# Patient Record
Sex: Male | Born: 2000 | Hispanic: No | Marital: Single | Smoking: Never smoker
Health system: Southern US, Community
[De-identification: ages and names within clinical notes are randomized; demographics above are authoritative.]

## PROBLEM LIST (undated history)

## (undated) DIAGNOSIS — Z9229 Personal history of other drug therapy: Secondary | ICD-10-CM

## (undated) DIAGNOSIS — S83206A Unspecified tear of unspecified meniscus, current injury, right knee, initial encounter: Secondary | ICD-10-CM

## (undated) HISTORY — PX: NO PAST SURGERIES: SHX2092

---
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
## 2021-02-17 DIAGNOSIS — Z20822 Contact with and (suspected) exposure to covid-19: Secondary | ICD-10-CM | POA: Diagnosis not present

## 2021-02-18 DIAGNOSIS — Z20822 Contact with and (suspected) exposure to covid-19: Secondary | ICD-10-CM | POA: Diagnosis not present

## 2021-03-27 IMAGING — MR MR KNEE*R* W/O CM
4 of 7 series · 22 of 40 positions shown · non-contrast
Comparison: None.

CLINICAL DATA: Right knee injury after being tackled 10 weeks ago
playing soccer

EXAM:
MRI OF THE RIGHT KNEE WITHOUT CONTRAST
TECHNIQUE: Multiplanar, multisequence MR imaging of the knee was performed. No
intravenous contrast was administered.

[Series 3: T2 fat-sat · axial · 4.0mm · 0.50mm/px · z∈[-82,+63]mm · 6 of 30 slices shown (1 of 2)]
[im 1/30]
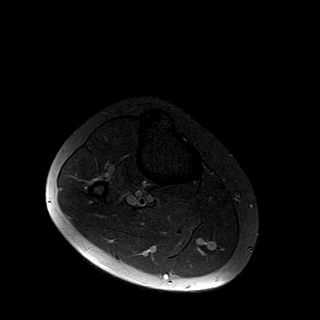
[im 6/30]
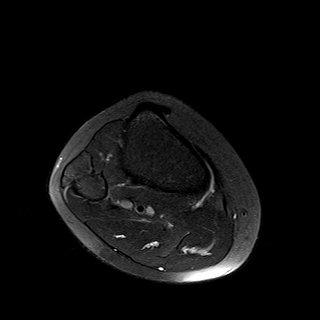
[im 12/30]
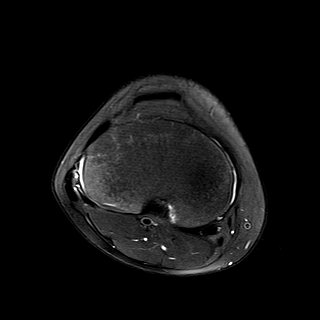
[im 18/30]
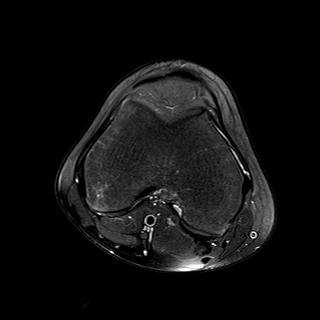
[im 24/30]
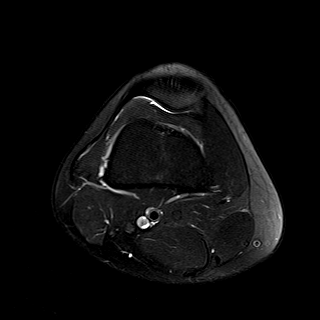
[im 30/30]
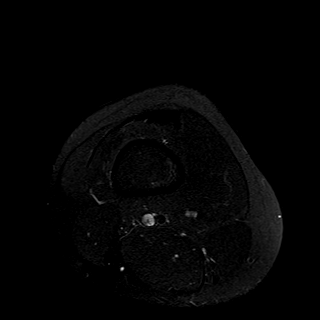

[Series 5: T2 fat-sat · coronal · 4.0mm · 0.29mm/px · 4 of 23 slices shown (2 of 2)]
[im 1/23]
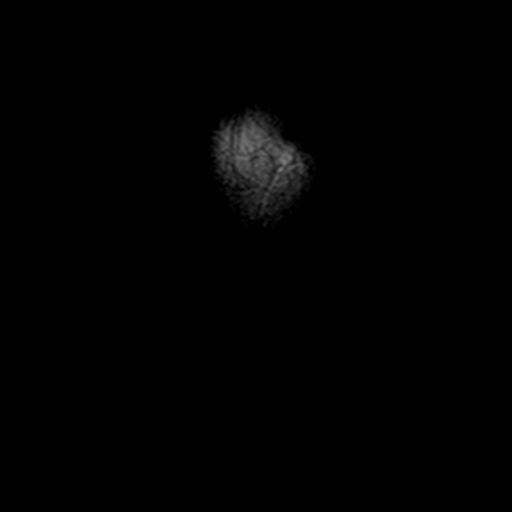
[im 6/23]
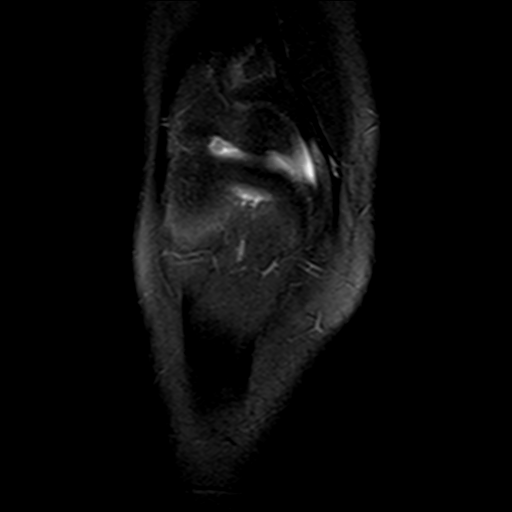
[im 12/23]
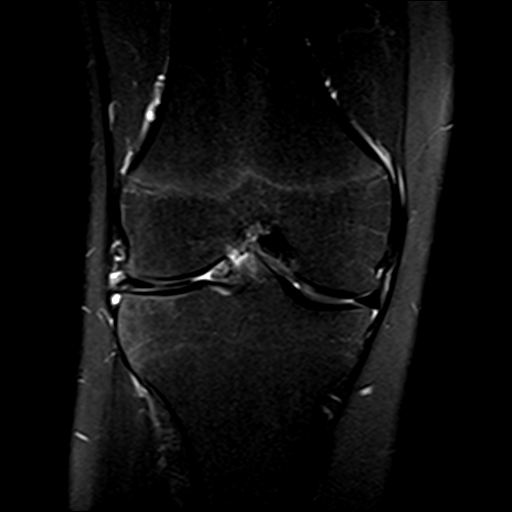
[im 23/23]
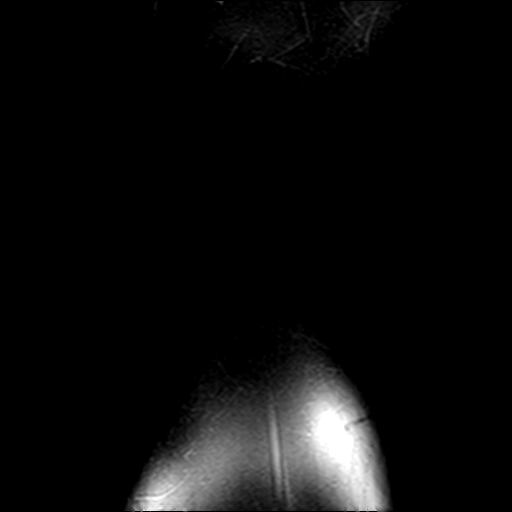

[Series 7: PD fat-sat · coronal · 3.0mm · 0.29mm/px · 6 of 29 slices shown (1 of 2)]
[im 1/29]
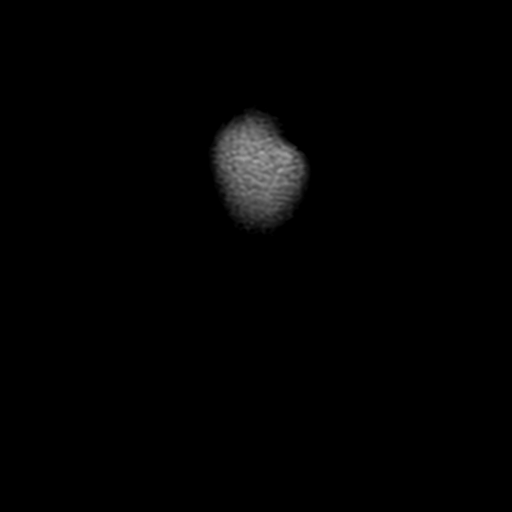
[im 6/29]
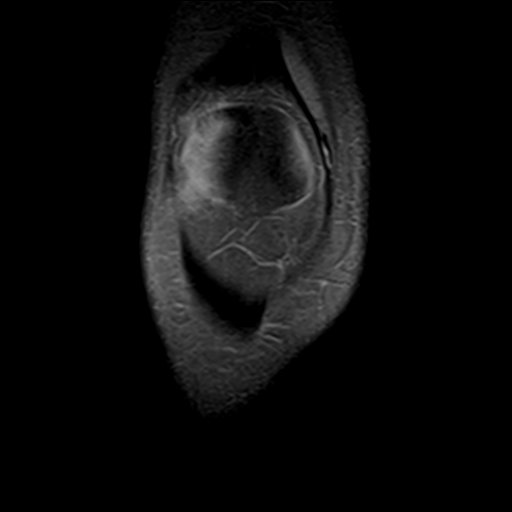
[im 12/29]
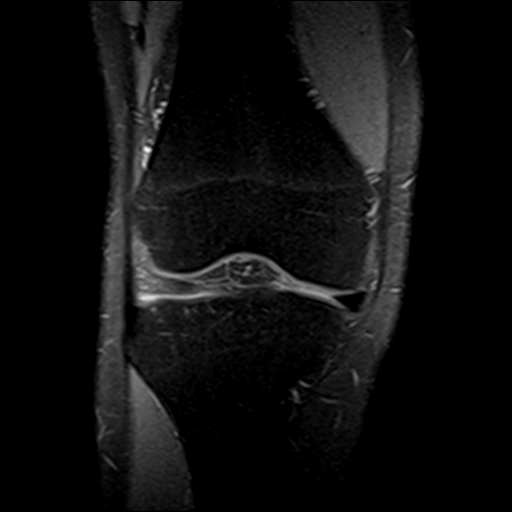
[im 17/29]
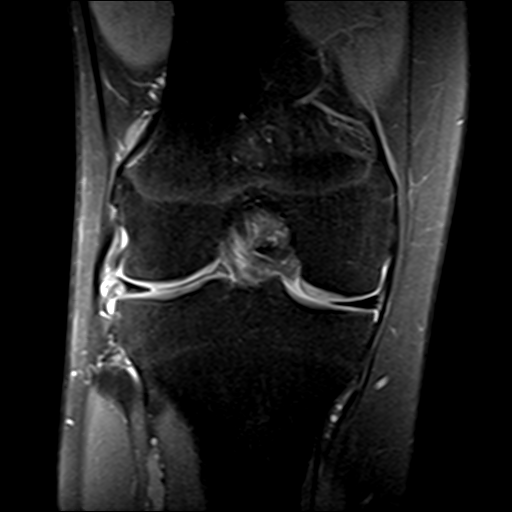
[im 23/29]
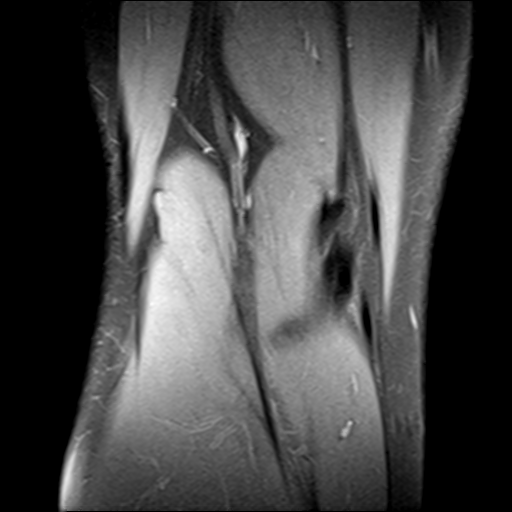
[im 29/29]
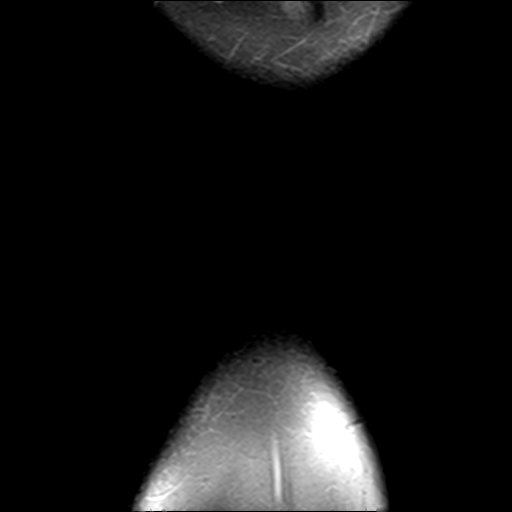

[Series 9: PD fat-sat · sagittal · 3.0mm · 0.29mm/px · 6 of 29 slices shown (2 of 2)]
[im 1/29]
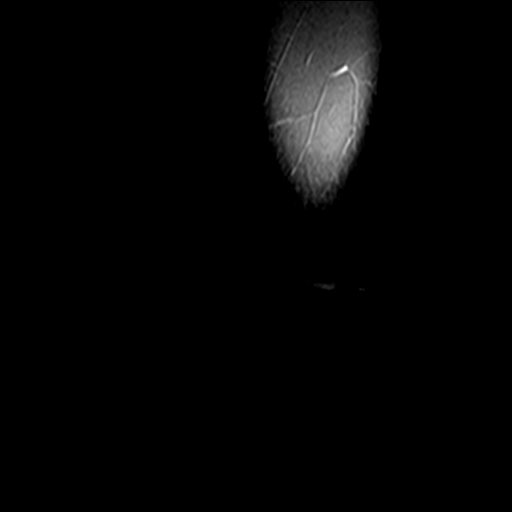
[im 6/29]
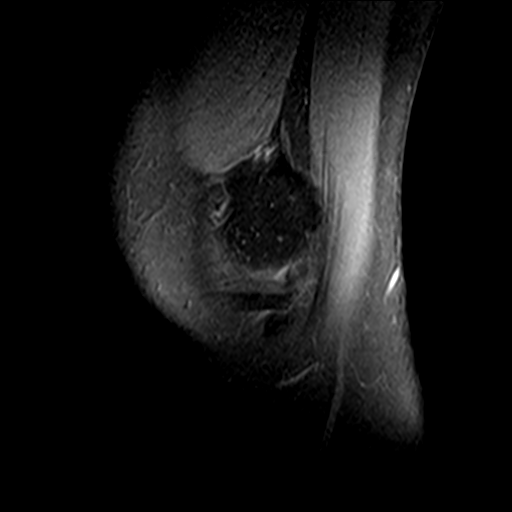
[im 12/29]
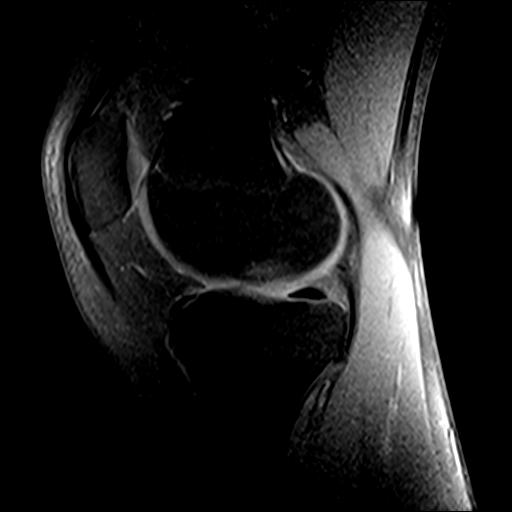
[im 17/29]
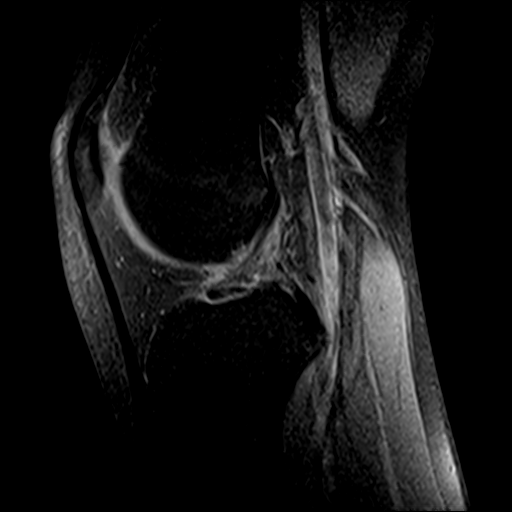
[im 23/29]
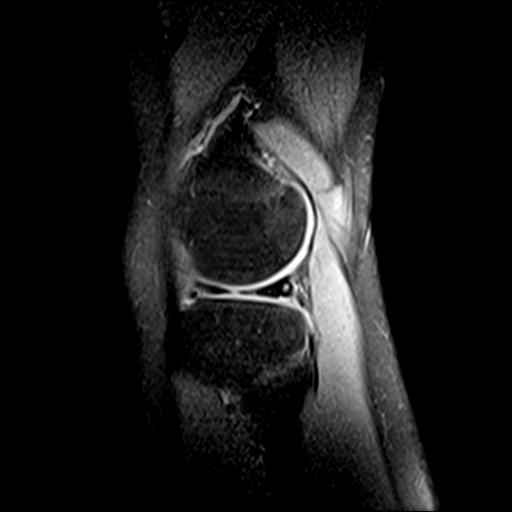
[im 29/29]
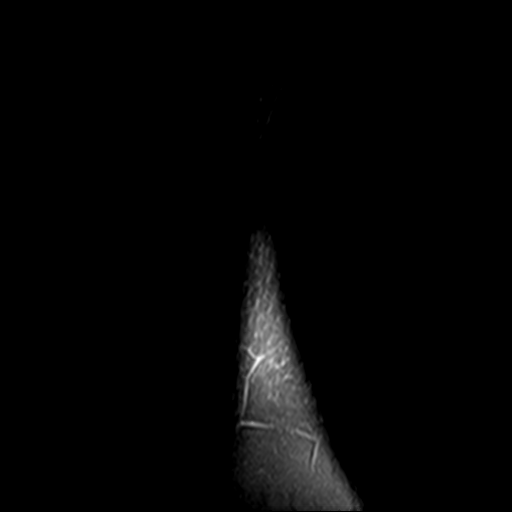

[22 of 40 positions shown; findings below may reference images not displayed]

FINDINGS: MENISCI

Medial: Intact.

Lateral: There is a peripheral horizontal longitudinal tear seen of
the posterior horn of the medial meniscus which extends to the mid
body and anterior horn. The root attachments however are still
intact.

LIGAMENTS

Cruciates: The ACL is intact. The PCL is intact.

Collaterals: The MCL is intact. The lateral collateral ligamentous
complex is intact.

CARTILAGE

Patellofemoral: Normal.

Medial compartment: Normal.

Lateral compartment: There is a probable focal disruption of the
popliteal fibular ligament. Mildly increased signal seen in the
fibular collateral ligament, however it is intact.

BONES: There is increased marrow signal seen in the posterior
lateral femoral condyle and the lateral tibial plateau. No definite
osseous fracture is seen. No avascular necrosis. No pathologic
marrow infiltration.

JOINT: No joint effusion. Normal Nayfi Chamo. No plical
thickening.

EXTENSOR MECHANISM: The patellar and quadriceps tendon are intact.
The retinaculum is unremarkable.

POPLITEAL FOSSA: No popliteal cyst.

OTHER:  The visualized muscles are normal in appearance.
IMPRESSION: 1. Horizontal nondisplaced tear of the posterior lateral meniscus
extending to the anterior horn. The root attachment however still
intact.
2. Osseous contusions of the lateral femoral condyle and lateral
tibial plateau. No osseous fracture.
3. Probable focal disruption of the popliteal fibular ligament
intrasubstance sprain of the fibular collateral ligament.

## 2024-01-21 ENCOUNTER — Other Ambulatory Visit: Payer: Self-pay

## 2024-01-21 ENCOUNTER — Encounter (HOSPITAL_BASED_OUTPATIENT_CLINIC_OR_DEPARTMENT_OTHER): Payer: Self-pay | Admitting: Orthopedic Surgery

## 2024-01-24 NOTE — H&P (Signed)
 PREOPERATIVE H&P  Chief Complaint: LEFT KNEE LATERAL MENISCUS TEAR, GANGLION  HPI: Jesse Shields is a 23 y.o. male who presents with a diagnosis of LEFT KNEE LATERAL MENISCUS TEAR, GANGLION. Symptoms are rated as moderate to severe, and have been worsening.  This is significantly impairing activities of daily living.  He has elected for surgical management.   Past Medical History:  Diagnosis Date   Immunizations up to date    Right knee meniscal tear    injury playing soccer 10/ 2020   Past Surgical History:  Procedure Laterality Date   KNEE ARTHROSCOPY WITH LATERAL MENISECTOMY Right 08/25/2019   Procedure: KNEE ARTHROSCOPY WITH LATERAL MENISECTOMY;  Surgeon: [name], MD;  Location: [facility];  Service: Orthopedics;  Laterality: Right;   Social History   Socioeconomic History   Marital status: Single    Spouse name: Not on file   Number of children: Not on file   Years of education: Not on file   Highest education level: Not on file  Occupational History   Not on file  Tobacco Use   Smoking status: Never   Smokeless tobacco: Never  Vaping Use   Vaping status: Never Used  Substance and Sexual Activity   Alcohol use: Never    Alcohol/week: 0.0 standard drinks of alcohol   Drug use: Never   Sexual activity: Not on file  Other Topics Concern   Not on file  Social History Narrative   No Family anesthesia problems      No smoker in home.      Lives w/ both parents and 3 silbings      Social Drivers of Corporate investment banker Strain: Not on file  Food Insecurity: Not on file  Transportation Needs: Not on file  Physical Activity: Not on file  Stress: Not on file  Social Connections: Not on file   History reviewed. No pertinent family history. No Known Allergies Prior to Admission medications   Medication Sig Start Date End Date Taking? Authorizing Provider  HYDROcodone -acetaminophen  (NORCO) 5-325 MG tablet Take 1-2 tablets by mouth  every 6 (six) hours as needed for severe pain. 08/25/19   Martensen, Missie Ammons III, PA-C  ondansetron  (ZOFRAN ) 4 MG tablet Take 1 tablet (4 mg total) by mouth every 8 (eight) hours as needed for nausea or vomiting. 08/25/19   Carlie Chen III, PA-C     Positive ROS: All other systems have been reviewed and were otherwise negative with the exception of those mentioned in the HPI and as above.  Physical Exam: General: Alert, no acute distress Cardiovascular: No pedal edema Respiratory: No cyanosis, no use of accessory musculature GI: No organomegaly, abdomen is soft and non-tender Skin: No lesions in the area of chief complaint Neurologic: Sensation intact distally Psychiatric: Patient is competent for consent with normal mood and affect Lymphatic: No axillary or cervical lymphadenopathy  MUSCULOSKELETAL: TTP anterolateral joint line, pain with extension, palpable crepitus laterally, + McMurrays test, NVI   Imaging: MRI shows a tear in the anterior horn and body of the lateral meniscus with a parameniscal cyst   Assessment: LEFT KNEE LATERAL MENISCUS TEAR, GANGLION  Plan: Plan for Procedure(s): REPAIR, MENISCUS, KNEE EXCISION, LESION, MENISCUS  The risks benefits and alternatives were discussed with the patient including but not limited to the risks of nonoperative treatment, versus surgical intervention including infection, bleeding, nerve injury,  blood clots, cardiopulmonary complications, morbidity, mortality, among others, and they were willing to proceed.  Weightbearing: NWB LLE vs WBAT in KI Orthopedic devices: knee brace Showering: POD 3 Dressing: reinforce PRN Medicines: Norco, Celebrex, Zofran   Discharge: home Follow up: 02/07/24 at 9:45am    Lore Rode, PA-C Office [phone] 01/24/2024 2:07 PM

## 2024-01-28 ENCOUNTER — Ambulatory Visit (HOSPITAL_BASED_OUTPATIENT_CLINIC_OR_DEPARTMENT_OTHER)
Admission: RE | Admit: 2024-01-28 | Discharge: 2024-01-28 | Disposition: A | Discharge status: Home / Self Care | Attending: Orthopedic Surgery | Admitting: Orthopedic Surgery

## 2024-01-28 ENCOUNTER — Ambulatory Visit (HOSPITAL_BASED_OUTPATIENT_CLINIC_OR_DEPARTMENT_OTHER): Admitting: Anesthesiology

## 2024-01-28 ENCOUNTER — Encounter (HOSPITAL_BASED_OUTPATIENT_CLINIC_OR_DEPARTMENT_OTHER)
Admission: RE | Disposition: A | Discharge status: Home / Self Care | Payer: Self-pay | Source: Home / Self Care | Attending: Orthopedic Surgery

## 2024-01-28 ENCOUNTER — Other Ambulatory Visit: Payer: Self-pay

## 2024-01-28 ENCOUNTER — Encounter (HOSPITAL_BASED_OUTPATIENT_CLINIC_OR_DEPARTMENT_OTHER): Payer: Self-pay | Admitting: Orthopedic Surgery

## 2024-01-28 DIAGNOSIS — M67462 Ganglion, left knee: Secondary | ICD-10-CM | POA: Diagnosis not present

## 2024-01-28 DIAGNOSIS — S83282A Other tear of lateral meniscus, current injury, left knee, initial encounter: Secondary | ICD-10-CM

## 2024-01-28 HISTORY — PX: KNEE ARTHROSCOPY WITH DRILLING/MICROFRACTURE: SHX6425

## 2024-01-28 HISTORY — PX: MENISCUS REPAIR: SHX5179

## 2024-01-28 HISTORY — PX: EXCISION, LESION, MENISCUS: SHX7565

## 2024-01-28 SURGERY — REPAIR, MENISCUS, KNEE
Anesthesia: General | Site: Knee | Laterality: Left

## 2024-01-28 MED ORDER — MIDAZOLAM HCL 2 MG/2ML IJ SOLN
INTRAMUSCULAR | Status: AC
  Filled 2024-01-28: qty 2

## 2024-01-28 MED ORDER — FENTANYL CITRATE (PF) 100 MCG/2ML IJ SOLN
INTRAMUSCULAR | Status: AC
  Filled 2024-01-28: qty 2

## 2024-01-28 MED ORDER — LIDOCAINE HCL (CARDIAC) PF 100 MG/5ML IV SOSY
PREFILLED_SYRINGE | INTRAVENOUS | Status: DC | PRN
  Administered 2024-01-28: 40 mg via INTRAVENOUS

## 2024-01-28 MED ORDER — SODIUM CHLORIDE 0.9 % IR SOLN
Status: DC | PRN
Start: 2024-01-28 — End: 2024-01-28
  Administered 2024-01-28: 4000 mL

## 2024-01-28 MED ORDER — CEFAZOLIN SODIUM-DEXTROSE 2-4 GM/100ML-% IV SOLN
INTRAVENOUS | Status: AC
  Filled 2024-01-28: qty 100

## 2024-01-28 MED ORDER — POVIDONE-IODINE 10 % EX SWAB
2.0000 | Freq: Once | CUTANEOUS | Status: AC
  Administered 2024-01-28: 2 via TOPICAL

## 2024-01-28 MED ORDER — CELECOXIB 200 MG PO CAPS: 200.0000 mg | ORAL_CAPSULE | Freq: Two times a day (BID) | ORAL | 0 refills | Status: AC | PRN

## 2024-01-28 MED ORDER — ACETAMINOPHEN 500 MG PO TABS
ORAL_TABLET | ORAL | Status: AC
  Filled 2024-01-28: qty 2

## 2024-01-28 MED ORDER — ACETAMINOPHEN 500 MG PO TABS
1000.0000 mg | ORAL_TABLET | Freq: Once | ORAL | Status: AC
  Administered 2024-01-28: 1000 mg via ORAL

## 2024-01-28 MED ORDER — KETOROLAC TROMETHAMINE 30 MG/ML IJ SOLN
INTRAMUSCULAR | Status: AC
  Filled 2024-01-28: qty 1

## 2024-01-28 MED ORDER — PROPOFOL 10 MG/ML IV BOLUS
INTRAVENOUS | Status: DC | PRN
  Administered 2024-01-28: 200 mg via INTRAVENOUS

## 2024-01-28 MED ORDER — KETOROLAC TROMETHAMINE 30 MG/ML IJ SOLN
INTRAMUSCULAR | Status: DC | PRN
  Administered 2024-01-28: 30 mg via INTRAVENOUS

## 2024-01-28 MED ORDER — DEXAMETHASONE SODIUM PHOSPHATE 10 MG/ML IJ SOLN
INTRAMUSCULAR | Status: DC | PRN
  Administered 2024-01-28: 10 mg via INTRAVENOUS

## 2024-01-28 MED ORDER — CEFAZOLIN SODIUM-DEXTROSE 2-4 GM/100ML-% IV SOLN
2.0000 g | INTRAVENOUS | Status: AC
  Administered 2024-01-28: 2 g via INTRAVENOUS

## 2024-01-28 MED ORDER — MIDAZOLAM HCL 2 MG/2ML IJ SOLN
INTRAMUSCULAR | Status: DC | PRN
  Administered 2024-01-28: 2 mg via INTRAVENOUS

## 2024-01-28 MED ORDER — FENTANYL CITRATE (PF) 100 MCG/2ML IJ SOLN
INTRAMUSCULAR | Status: DC | PRN
  Administered 2024-01-28 (×2): 25 ug via INTRAVENOUS

## 2024-01-28 MED ORDER — ONDANSETRON 4 MG PO TBDP: 4.0000 mg | ORAL_TABLET | Freq: Three times a day (TID) | ORAL | 0 refills | Status: AC | PRN

## 2024-01-28 MED ORDER — FENTANYL CITRATE (PF) 100 MCG/2ML IJ SOLN
25.0000 ug | INTRAMUSCULAR | Status: DC | PRN
  Administered 2024-01-28 (×2): 25 ug via INTRAVENOUS

## 2024-01-28 MED ORDER — HYDROCODONE-ACETAMINOPHEN 10-325 MG PO TABS: 1.0000 | ORAL_TABLET | Freq: Four times a day (QID) | ORAL | 0 refills | Status: AC | PRN

## 2024-01-28 MED ORDER — ONDANSETRON HCL 4 MG/2ML IJ SOLN: 4.0000 mg | Freq: Four times a day (QID) | INTRAMUSCULAR | Status: DC | PRN

## 2024-01-28 MED ORDER — BUPIVACAINE HCL (PF) 0.5 % IJ SOLN
INTRAMUSCULAR | Status: DC | PRN
Start: 2024-01-28 — End: 2024-01-28
  Administered 2024-01-28: 20 mL

## 2024-01-28 MED ORDER — LACTATED RINGERS IV SOLN: INTRAVENOUS | Status: DC

## 2024-01-28 MED ORDER — OXYCODONE HCL 5 MG PO TABS: 5.0000 mg | ORAL_TABLET | Freq: Once | ORAL | Status: DC | PRN

## 2024-01-28 MED ORDER — ONDANSETRON HCL 4 MG/2ML IJ SOLN
INTRAMUSCULAR | Status: DC | PRN
  Administered 2024-01-28: 4 mg via INTRAVENOUS

## 2024-01-28 MED ORDER — OXYCODONE HCL 5 MG/5ML PO SOLN: 5.0000 mg | Freq: Once | ORAL | Status: DC | PRN

## 2024-01-28 SURGICAL SUPPLY — 42 items
BLADE SURG 15 STRL LF DISP TIS (BLADE) IMPLANT
BNDG ELASTIC 6INX 5YD STR LF (GAUZE/BANDAGES/DRESSINGS) ×1 IMPLANT
BURR OVAL 8 FLU 4.0X13 (MISCELLANEOUS) IMPLANT
CHLORAPREP W/TINT 26 (MISCELLANEOUS) ×1 IMPLANT
CUFF TRNQT CYL 24X4X40X1 (TOURNIQUET CUFF) IMPLANT
CUFF TRNQT CYL 34X4.125X (TOURNIQUET CUFF) ×1 IMPLANT
DISSECTOR 3.8MM X 13CM (MISCELLANEOUS) ×1 IMPLANT
DISSECTOR 4.0MM X 13CM (MISCELLANEOUS) IMPLANT
DRAPE ARTHROSCOPY W/POUCH 90 (DRAPES) ×1 IMPLANT
DRAPE IMP U-DRAPE 54X76 (DRAPES) ×1 IMPLANT
DRAPE U-SHAPE 47X51 STRL (DRAPES) ×1 IMPLANT
DRSG EMULSION OIL 3X3 NADH (GAUZE/BANDAGES/DRESSINGS) ×1 IMPLANT
ELECTRODE REM PT RTRN 9FT ADLT (ELECTROSURGICAL) IMPLANT
EXCALIBUR 3.8MM X 13CM (MISCELLANEOUS) IMPLANT
GAUZE SPONGE 4X4 12PLY STRL (GAUZE/BANDAGES/DRESSINGS) ×1 IMPLANT
GLOVE BIO SURGEON STRL SZ7.5 (GLOVE) ×1 IMPLANT
GLOVE BIOGEL PI IND STRL 7.5 (GLOVE) ×2 IMPLANT
GLOVE BIOGEL PI IND STRL 8 (GLOVE) ×1 IMPLANT
GLOVE BIOGEL PI IND STRL 8.5 (GLOVE) IMPLANT
GLOVE SURG SYN 7.5 E (GLOVE) ×2 IMPLANT
GLOVE SURG SYN 7.5 PF PI (GLOVE) ×1 IMPLANT
GOWN STRL REUS W/ TWL LRG LVL3 (GOWN DISPOSABLE) ×3 IMPLANT
GOWN STRL REUS W/ TWL XL LVL3 (GOWN DISPOSABLE) ×1 IMPLANT
MANIFOLD NEPTUNE II (INSTRUMENTS) ×1 IMPLANT
NDL HD SCORPION MEGA LOADER (NEEDLE) IMPLANT
NDL SUT 2-0 SCORPION KNEE (NEEDLE) IMPLANT
NEEDLE SUT 2-0 SCORPION KNEE (NEEDLE) IMPLANT
PACK ARTHROSCOPY DSU (CUSTOM PROCEDURE TRAY) ×1 IMPLANT
PACK BASIN DAY SURGERY FS (CUSTOM PROCEDURE TRAY) ×1 IMPLANT
PACK ICE MAXI GEL EZY WRAP (MISCELLANEOUS) ×1 IMPLANT
PENCIL SMOKE EVACUATOR (MISCELLANEOUS) IMPLANT
SLEEVE SCD COMPRESS KNEE MED (STOCKING) ×1 IMPLANT
SUCTION TUBE FRAZIER 10FR DISP (SUCTIONS) IMPLANT
SUT ETHILON 3 0 PS 1 (SUTURE) ×1 IMPLANT
SUT PDS AB 0 CT 36 (SUTURE) IMPLANT
TAPE CLOTH 3X10 TAN LF (GAUZE/BANDAGES/DRESSINGS) IMPLANT
TOWEL GREEN STERILE FF (TOWEL DISPOSABLE) ×1 IMPLANT
TUBE CONNECTING 20X1/4 (TUBING) IMPLANT
TUBING ARTHROSCOPY IRRIG 16FT (MISCELLANEOUS) ×1 IMPLANT
WAND ABLATOR APOLLO I90 (BUR) IMPLANT
WATER STERILE IRR 1000ML POUR (IV SOLUTION) ×1 IMPLANT
WRAP KNEE MAXI GEL POST OP (GAUZE/BANDAGES/DRESSINGS) ×1 IMPLANT

## 2024-01-28 NOTE — Transfer of Care (Signed)
 Immediate Anesthesia Transfer of Care Note  Patient: Jesse Shields  Procedure(s) Performed: REPAIR, MENISCUS, KNEE (Left: Knee) EXCISION, LEFT KNEE GANGLION (Left: Knee) ARTHROSCOPY, KNEE, WITH ABRASION ARTHROPLASTY OR MICROFRACTURE (Left: Knee)  Patient Location: PACU  Anesthesia Type:General  Level of Consciousness: awake and patient cooperative  Airway & Oxygen Therapy: Patient Spontanous Breathing and Patient connected to nasal cannula oxygen  Post-op Assessment: Report given to RN and Post -op Vital signs reviewed and stable  Post vital signs: Reviewed and stable  Last Vitals:  Vitals Value Taken Time  BP    Temp    Pulse 72 01/28/24 1257  Resp 17 01/28/24 1257  SpO2 100 % 01/28/24 1257    Last Pain:  Vitals:   01/28/24 1009  TempSrc: Temporal  PainSc: 0-No pain         Complications: No notable events documented.

## 2024-01-28 NOTE — Interval H&P Note (Signed)
 History and Physical Interval Note:  01/28/2024 11:04 AM  Jesse Shields  has presented today for surgery, with the diagnosis of LEFT KNEE LATERAL MENISCUS TEAR, GANGLION.  The various methods of treatment have been discussed with the patient and family. After consideration of risks, benefits and other options for treatment, the patient has consented to  Procedure(s): REPAIR, MENISCUS, KNEE (Left) EXCISION, LESION, MENISCUS (Left) as a surgical intervention.  The patient's history has been reviewed, patient examined, no change in status, stable for surgery.  I have reviewed the patient's chart and labs.  Questions were answered to the patient's satisfaction.     [name]

## 2024-01-28 NOTE — Op Note (Signed)
 01/28/2024  12:35 PM  PATIENT:  Jesse Shields    PRE-OPERATIVE DIAGNOSIS:  LEFT KNEE LATERAL MENISCUS TEAR, GANGLION  POST-OPERATIVE DIAGNOSIS:  Same  PROCEDURE:  REPAIR, MENISCUS, KNEE, EXCISION, LEFT KNEE GANGLION, ARTHROSCOPY, KNEE, WITH ABRASION ARTHROPLASTY OR MICROFRACTURE  SURGEON:  [name], MD  ASSISTANT: Marzella Solan, PA-C, he was present and scrubbed throughout the case, critical for completion in a timely fashion, and for retraction, instrumentation, and closure.   ANESTHESIA:   General  BLOOD LOSS: min  COMPLICATIONS: None   PREOPERATIVE INDICATIONS:  Jesse Shields is a  23 y.o. male with a diagnosis of LEFT KNEE LATERAL MENISCUS TEAR, GANGLION who failed conservative measures and elected for surgical management.    The risks benefits and alternatives were discussed with the patient preoperatively including but not limited to the risks of infection, bleeding, nerve injury, cardiopulmonary complications, the need for revision surgery, among others, and the patient was willing to proceed.  OPERATIVE IMPLANTS: pds stitch  OPERATIVE FINDINGS: Examination under anesthesia: stable Diagnostic Arthroscopy:  articular cartilage:stable Medial meniscus: Stable Lateral meniscus: Capsular tear anterior horn Anterior cruciate ligament/PCL: Stable Loose bodies: None    OPERATIVE PROCEDURE:  Patient was identified in the preoperative holding area and site was marked by me male was transported to the operating theater and placed on the table in supine position taking care to pad all bony prominences. After a preincinduction time out anesthesia was induced.  male received Ancef  for preoperative antibiotics. The left lower extremity was prepped and draped in normal sterile fashion and a pre-incision timeout was performed.   A small stab incision was made in the anterolateral portal position. The arthroscope was introduced in the joint. A medial portal was then established under  direct visualization just above the anterior horn of the medial meniscus. Diagnostic arthroscopy was then carried out with findings as described above.  The first examined his knee he had a small radial tear to the lateral meniscus I debrided this with a shaver  Next I made an incision after localizing this with a spinal needle at the capsular injury as this open incision to develop around the ganglion cyst and evacuate this excising the ganglion cyst from the knee  Next I used a spinal needle to pass a PDS stitch in a horizontal fashion around the anterior horn of the meniscus I then secured this around the capsule and was very happy with the repair of the meniscus to the capsule  Next I performed a microfracture at his notch to aid in healing of the meniscus.  The arthroscopic equipment was removed from the joint and the portals were closed with 3-0 nylon in an interrupted fashion. The knee was infiltrated with Marcaine .  Sterile dressings were then applied including Xeroform 4 x 4's ABDs an ACE bandage.  The patient was then allowed to awaken from general anesthesia, transferred to the stretcher and taken to the recovery room in stable condition.  POSTOPERATIVE PLAN: The patient will be discharged home today and will followup in one week for suture removal and wound check.  VTE prophylaxis: Mobilization and aspirin

## 2024-01-28 NOTE — Discharge Instructions (Addendum)
 POST-OPERATIVE OPIOID TAPER INSTRUCTIONS: It is important to wean off of your opioid medication as soon as possible. If you do not need pain medication after your surgery it is ok to stop day one. Opioids include: Codeine, Hydrocodone (Norco, Vicodin), Oxycodone(Percocet, oxycontin) and hydromorphone amongst others.  Long term and even short term use of opiods can cause: Increased pain response Dependence Constipation Depression Respiratory depression And more.  Withdrawal symptoms can include Flu like symptoms Nausea, vomiting And more Techniques to manage these symptoms Hydrate well Eat regular healthy meals Stay active Use relaxation techniques(deep breathing, meditating, yoga) Do Not substitute Alcohol to help with tapering If you have been on opioids for less than two weeks and do not have pain than it is ok to stop all together.  Plan to wean off of opioids This plan should start within one week post op of your joint replacement. Maintain the same interval or time between taking each dose and first decrease the dose.  Cut the total daily intake of opioids by one tablet each day Next start to increase the time between doses. The last dose that should be eliminated is the evening dose.    Post Anesthesia Home Care Instructions NO TYLENOL  UNTIL AFTER 3PM!  Activity: Get plenty of rest for the remainder of the day. A responsible individual must stay with you for 24 hours following the procedure.  For the next 24 hours, DO NOT: -Drive a car -Advertising copywriter -Drink alcoholic beverages -Take any medication unless instructed by your physician -Make any legal decisions or sign important papers.  Meals: Start with liquid foods such as gelatin or soup. Progress to regular foods as tolerated. Avoid greasy, spicy, heavy foods. If nausea and/or vomiting occur, drink only clear liquids until the nausea and/or vomiting subsides. Call your physician if vomiting continues.  Special  Instructions/Symptoms: Your throat may feel dry or sore from the anesthesia or the breathing tube placed in your throat during surgery. If this causes discomfort, gargle with warm salt water. The discomfort should disappear within 24 hours.  If you had a scopolamine patch placed behind your ear for the management of post- operative nausea and/or vomiting:  1. The medication in the patch is effective for 72 hours, after which it should be removed.  Wrap patch in a tissue and discard in the trash. Wash hands thoroughly with soap and water. 2. You may remove the patch earlier than 72 hours if you experience unpleasant side effects which may include dry mouth, dizziness or visual disturbances. 3. Avoid touching the patch. Wash your hands with soap and water after contact with the patch.

## 2024-01-28 NOTE — Anesthesia Preprocedure Evaluation (Signed)
 Anesthesia Evaluation  Patient identified by MRN, date of birth, ID band Patient awake    Reviewed: Allergy & Precautions, H&P , NPO status , Patient's Chart, lab work & pertinent test results  Airway Mallampati: II   Neck ROM: full    Dental   Pulmonary neg pulmonary ROS   breath sounds clear to auscultation       Cardiovascular negative cardio ROS  Rhythm:regular Rate:Normal     Neuro/Psych    GI/Hepatic   Endo/Other    Renal/GU      Musculoskeletal Right knee meniscal tear   Abdominal   Peds  Hematology   Anesthesia Other Findings   Reproductive/Obstetrics                             Anesthesia Physical Anesthesia Plan  ASA: 1  Anesthesia Plan: General   Post-op Pain Management:    Induction: Intravenous  PONV Risk Score and Plan: 2 and Ondansetron , Dexamethasone  and Midazolam   Airway Management Planned: LMA  Additional Equipment:   Intra-op Plan:   Post-operative Plan: Extubation in OR  Informed Consent: I have reviewed the patients History and Physical, chart, labs and discussed the procedure including the risks, benefits and alternatives for the proposed anesthesia with the patient or authorized representative who has indicated his/her understanding and acceptance.     Dental advisory given  Plan Discussed with: CRNA, Anesthesiologist and Surgeon  Anesthesia Plan Comments:        Anesthesia Quick Evaluation

## 2024-01-28 NOTE — Anesthesia Procedure Notes (Signed)
 Procedure Name: LMA Insertion Date/Time: 01/28/2024 11:47 AM  Performed by: Lonia Ro, CRNAPre-anesthesia Checklist: Patient identified, Patient being monitored, Emergency Drugs available, Timeout performed and Suction available Patient Re-evaluated:Patient Re-evaluated prior to induction Oxygen Delivery Method: Circle System Utilized Preoxygenation: Pre-oxygenation with 100% oxygen Induction Type: IV induction Ventilation: Mask ventilation without difficulty LMA: LMA inserted LMA Size: 4.0 Number of attempts: 1 Placement Confirmation: positive ETCO2 and breath sounds checked- equal and bilateral

## 2024-01-29 ENCOUNTER — Encounter (HOSPITAL_BASED_OUTPATIENT_CLINIC_OR_DEPARTMENT_OTHER): Payer: Self-pay | Admitting: Orthopedic Surgery

## 2024-01-29 NOTE — Anesthesia Postprocedure Evaluation (Signed)
 Anesthesia Post Note  Patient: Jesse Shields  Procedure(s) Performed: REPAIR, MENISCUS, KNEE (Left: Knee) EXCISION, LEFT KNEE GANGLION (Left: Knee) ARTHROSCOPY, KNEE, WITH ABRASION ARTHROPLASTY OR MICROFRACTURE (Left: Knee)     Patient location during evaluation: PACU Anesthesia Type: General Level of consciousness: awake and alert Pain management: pain level controlled Vital Signs Assessment: post-procedure vital signs reviewed and stable Respiratory status: spontaneous breathing, nonlabored ventilation, respiratory function stable and patient connected to nasal cannula oxygen Cardiovascular status: blood pressure returned to baseline and stable Postop Assessment: no apparent nausea or vomiting Anesthetic complications: no   No notable events documented.  Last Vitals:  Vitals:   01/28/24 1330 01/28/24 1359  BP: 111/72 114/74  Pulse: 60 67  Resp: 15 14  Temp:  36.7 C  SpO2: 100% 99%    Last Pain:  Vitals:   01/29/24 0925  TempSrc:   PainSc: 0-No pain                 Joani Cosma S

## 2024-04-01 ENCOUNTER — Other Ambulatory Visit: Payer: Self-pay

## 2024-04-01 ENCOUNTER — Encounter (HOSPITAL_BASED_OUTPATIENT_CLINIC_OR_DEPARTMENT_OTHER): Payer: Self-pay | Admitting: Emergency Medicine

## 2024-04-01 ENCOUNTER — Emergency Department (HOSPITAL_BASED_OUTPATIENT_CLINIC_OR_DEPARTMENT_OTHER): Admission: EM | Admit: 2024-04-01 | Discharge: 2024-04-01 | Disposition: A | Discharge status: Home / Self Care

## 2024-04-01 ENCOUNTER — Emergency Department (HOSPITAL_BASED_OUTPATIENT_CLINIC_OR_DEPARTMENT_OTHER)

## 2024-04-01 DIAGNOSIS — Y92410 Unspecified street and highway as the place of occurrence of the external cause: Secondary | ICD-10-CM | POA: Insufficient documentation

## 2024-04-01 DIAGNOSIS — M25562 Pain in left knee: Secondary | ICD-10-CM | POA: Diagnosis present
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
## 2024-04-01 NOTE — Discharge Instructions (Addendum)
 For pain, you can take 1000 mg of Tylenol  or 1 g of Tylenol  every 6-8 hours.  Do not exceed more than 4000 mg or 4 g in a 24-hour period.  You can also take ibuprofen  600 to 800 mg every 6-8 hours as well.  Do not take this high-dose ibuprofen  for greater than a week.   Please follow-up with orthopedic surgeon if your pain continues.  Rest, ice, elevate and wear some form of compression over the knee.
